# Patient Record
Sex: Male | Born: 2000 | Race: White | Hispanic: No | Marital: Single | Smoking: Never smoker
Health system: Southern US, Community
[De-identification: ages and names within clinical notes are randomized; demographics above are authoritative.]

## PROBLEM LIST (undated history)

## (undated) DIAGNOSIS — E669 Obesity, unspecified: Secondary | ICD-10-CM

## (undated) DIAGNOSIS — R638 Other symptoms and signs concerning food and fluid intake: Secondary | ICD-10-CM

## (undated) DIAGNOSIS — T7840XA Allergy, unspecified, initial encounter: Secondary | ICD-10-CM

## (undated) HISTORY — DX: Allergy, unspecified, initial encounter: T78.40XA

## (undated) HISTORY — DX: Other symptoms and signs concerning food and fluid intake: R63.8

## (undated) HISTORY — DX: Obesity, unspecified: E66.9

---
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
## 2020-02-09 ENCOUNTER — Other Ambulatory Visit: Payer: Self-pay

## 2020-02-09 ENCOUNTER — Ambulatory Visit (INDEPENDENT_AMBULATORY_CARE_PROVIDER_SITE_OTHER): Payer: Managed Care, Other (non HMO) | Admitting: Pediatrics

## 2020-02-09 VITALS — BP 110/72 | Ht 74.5 in | Wt 205.7 lb

## 2020-02-09 DIAGNOSIS — Z68.41 Body mass index (BMI) pediatric, greater than or equal to 95th percentile for age: Secondary | ICD-10-CM

## 2020-02-09 DIAGNOSIS — Z00129 Encounter for routine child health examination without abnormal findings: Secondary | ICD-10-CM

## 2020-02-09 DIAGNOSIS — Z0001 Encounter for general adult medical examination with abnormal findings: Secondary | ICD-10-CM | POA: Diagnosis not present

## 2020-02-09 DIAGNOSIS — E663 Overweight: Secondary | ICD-10-CM | POA: Diagnosis not present

## 2020-02-09 NOTE — Progress Notes (Signed)
[employer]    Adolescent Well Care Visit Charles Maynard is a 19 y.o. male who is here for well care.    PCP:  Georgiann Hahn, MD   History was provided by the patient and mother.  Confidentiality was discussed with the patient and, if applicable, with caregiver as well.   Current Issues: Current concerns include none.   Nutrition: Nutrition/Eating Behaviors: good Adequate calcium in diet?: yes Supplements/ Vitamins: yes  Exercise/ Media: Play any Sports?/ Exercise: yes Screen Time:  < 2 hours Media Rules or Monitoring?: yes  Sleep:  Sleep: 8-10 hours  Social Screening: Lives with:  parents Parental relations:  good Activities, Work, and Regulatory affairs officer?: yes Concerns regarding behavior with peers?  no Stressors of note: no  Education:  School Grade: 12 School performance: doing well; no concerns School Behavior: doing well; no concerns  Menstruation:   No LMP for male patient.    Tobacco?  no Secondhand smoke exposure?  no Drugs/ETOH?  no  Sexually Active?  no     Safe at home, in school & in relationships?  Yes Safe to self?  Yes   Screenings: Patient has a dental home: yes  The patient completed the Rapid Assessment for Adolescent Preventive Services screening questionnaire and the following topics were identified as risk factors and discussed: healthy eating, exercise, seatbelt use, bullying, abuse/trauma, weapon use, tobacco use, marijuana use, drug use, condom use, birth control, sexuality, suicidality/self harm, mental health issues, social isolation, school problems, family problems and screen time    PHQ-9 completed and results indicated --no risk  Physical Exam:  Vitals:   02/09/20 1509  BP: 110/72  Weight: (!) 205 lb 11.2 oz (93.3 kg)  Height: 6' 2.5" (1.892 m)   BP 110/72   Ht 6' 2.5" (1.892 m)   Wt (!) 205 lb 11.2 oz (93.3 kg)   BMI 26.06 kg/m  Body mass index: body mass index is 26.06 kg/m. Blood pressure  percentiles are not available for patients who are 18 years or older.   Hearing Screening   125Hz  250Hz  500Hz  1000Hz  2000Hz  3000Hz  4000Hz  6000Hz  8000Hz   Right ear:   20 20 20 20 20     Left ear:   20 20 20 20 20       Visual Acuity Screening   Right eye Left eye Both eyes  Without correction:     With correction: 10/10 10/10     General Appearance:   alert, oriented, no acute distress and well nourished  HENT: Normocephalic, no obvious abnormality, conjunctiva clear  Mouth:   Normal appearing teeth, no obvious discoloration, dental caries, or dental caps  Neck:   Supple; thyroid: no enlargement, symmetric, no tenderness/mass/nodules  Chest normal  Lungs:   Clear to auscultation bilaterally, normal work of breathing  Heart:   Regular rate and rhythm, S1 and S2 normal, no murmurs;   Abdomen:   Soft, non-tender, no mass, or organomegaly  GU normal male genitals, no testicular masses or hernia  Musculoskeletal:   Tone and strength strong and symmetrical, all extremities               Lymphatic:   No cervical adenopathy  Skin/Hair/Nails:   Skin warm, dry and intact, no rashes, no bruises or petechiae  Neurologic:   Strength, gait, and coordination normal and age-appropriate     Assessment and Plan:   Well adolescent male  BMI is appropriate for age  Hearing screening result:normal Vision screening result: normal  Return in about 1 year (around 02/08/2021).Marland Kitchen  Georgiann Hahn, MD

## 2020-02-09 NOTE — Patient Instructions (Signed)

## 2020-02-10 ENCOUNTER — Encounter: Payer: Self-pay | Admitting: Pediatrics

## 2020-02-16 ENCOUNTER — Other Ambulatory Visit: Payer: Self-pay

## 2020-02-16 ENCOUNTER — Ambulatory Visit: Payer: Managed Care, Other (non HMO) | Admitting: Pediatrics

## 2020-02-16 ENCOUNTER — Encounter: Payer: Self-pay | Admitting: Pediatrics

## 2020-02-16 VITALS — Wt 208.1 lb

## 2020-02-16 DIAGNOSIS — R1084 Generalized abdominal pain: Secondary | ICD-10-CM | POA: Diagnosis not present

## 2020-02-16 NOTE — Patient Instructions (Signed)
Abdominal xray at [facility] [address] [facility] [facility]oon- will call with results Keep log of abdominal pain and diet Will refer to GI

## 2020-02-16 NOTE — Progress Notes (Signed)
Subjective:    History was provided by the patient. Charles Maynard is a 19 y.o. male who presents for evaluation of abdominal pain. Two weeks ago, Stefano developed abdominal pain in the mornings when he wakes up. He feels bloated frequently.During the day, primarily in the mornings, he will feel the need to use the bathroom but will only pass gas or a small amount of stool. He had diarrhea for the first 4 days but the diarrhea has resolved. He reports that the pain is the worst when he first gets up in the morning and improves during the day. He denies any fevers, vomiting, recent travel, ingestion of new foods. He did start a low carb diet prior to symptoms developing.   The following portions of the patient's history were reviewed and updated as appropriate: allergies, current medications, past family history, past medical history, past social history, past surgical history and problem list.  Review of Systems Pertinent items are noted in HPI    Objective:    Wt 208 lb 1.6 oz (94.4 kg)   BMI 26.36 kg/m  General:   alert, cooperative, appears stated age and no distress  Oropharynx:  lips, mucosa, and tongue normal; teeth and gums normal   Eyes:   conjunctivae/corneas clear. PERRL, EOM's intact. Fundi benign.   Ears:   normal TM's and external ear canals both ears  Neck:  no adenopathy, no carotid bruit, no JVD, supple, symmetrical, trachea midline and thyroid not enlarged, symmetric, no tenderness/mass/nodules  Thyroid:   no palpable nodule  Lung:  clear to auscultation bilaterally  Heart:   regular rate and rhythm, S1, S2 normal, no murmur, click, rub or gallop  Abdomen:  normal findings: soft, non-tender and abnormal findings:  hypoactive bowel sounds  Extremities:  extremities normal, atraumatic, no cyanosis or edema  Skin:  warm and dry, no hyperpigmentation, vitiligo, or suspicious lesions  CVA:   absent  Genitourinary:  defer exam  Neurological:   negative  Psychiatric:   normal  mood, behavior, speech, dress, and thought processes      Assessment:    Nonspecific abdominal pain, non organic etiology    Plan:     The diagnosis was discussed with the patient and evaluation and treatment plans outlined. See orders for lab and imaging studies. Reassured patient that symptoms are almost certainly benign and self-resolving. Adhere to low fat diet.  Referral to GI for further evaluation of abdominal pain Follow up as needed    Will call with abdominal xray results [phone]

## 2020-02-17 ENCOUNTER — Ambulatory Visit
Admission: RE | Admit: 2020-02-17 | Discharge: 2020-02-17 | Disposition: A | Discharge status: Home / Self Care | Payer: Managed Care, Other (non HMO) | Source: Ambulatory Visit | Attending: Pediatrics | Admitting: Pediatrics

## 2020-02-17 ENCOUNTER — Telehealth: Payer: Self-pay | Admitting: Pediatrics

## 2020-02-17 DIAGNOSIS — R1084 Generalized abdominal pain: Secondary | ICD-10-CM

## 2020-02-17 NOTE — Telephone Encounter (Signed)
Called Charles Maynard with abdominal xray results. Xray showed large stool burden Instructed [name] to take a daily stool softener until having regular bowel movements. [name] verbalized understanding and agreement.

## 2020-02-18 ENCOUNTER — Telehealth: Payer: Self-pay | Admitting: Pediatrics

## 2020-02-18 NOTE — Telephone Encounter (Signed)
Discussed abdominal xray results with mom as well as functional constipation S/S. Mom verbalized understanding and agreement.

## 2020-02-18 NOTE — Telephone Encounter (Signed)
Mom would like to you about the abdominal xray done on Charles Maynard she has some questions

## 2020-02-23 ENCOUNTER — Telehealth: Payer: Self-pay | Admitting: Pediatrics

## 2020-02-23 NOTE — Telephone Encounter (Signed)
Called and tried to contact to see if appointment time and date was appropriate. No answer/left message.

## 2021-01-15 IMAGING — CR DG ABDOMEN 1V
2 series · 2 of 2 positions shown · non-contrast
Comparison: None.

CLINICAL DATA: Acute abdominal pain and bloating.

EXAM:
ABDOMEN - 1 VIEW

[t abdomen supine (1 of 2)]
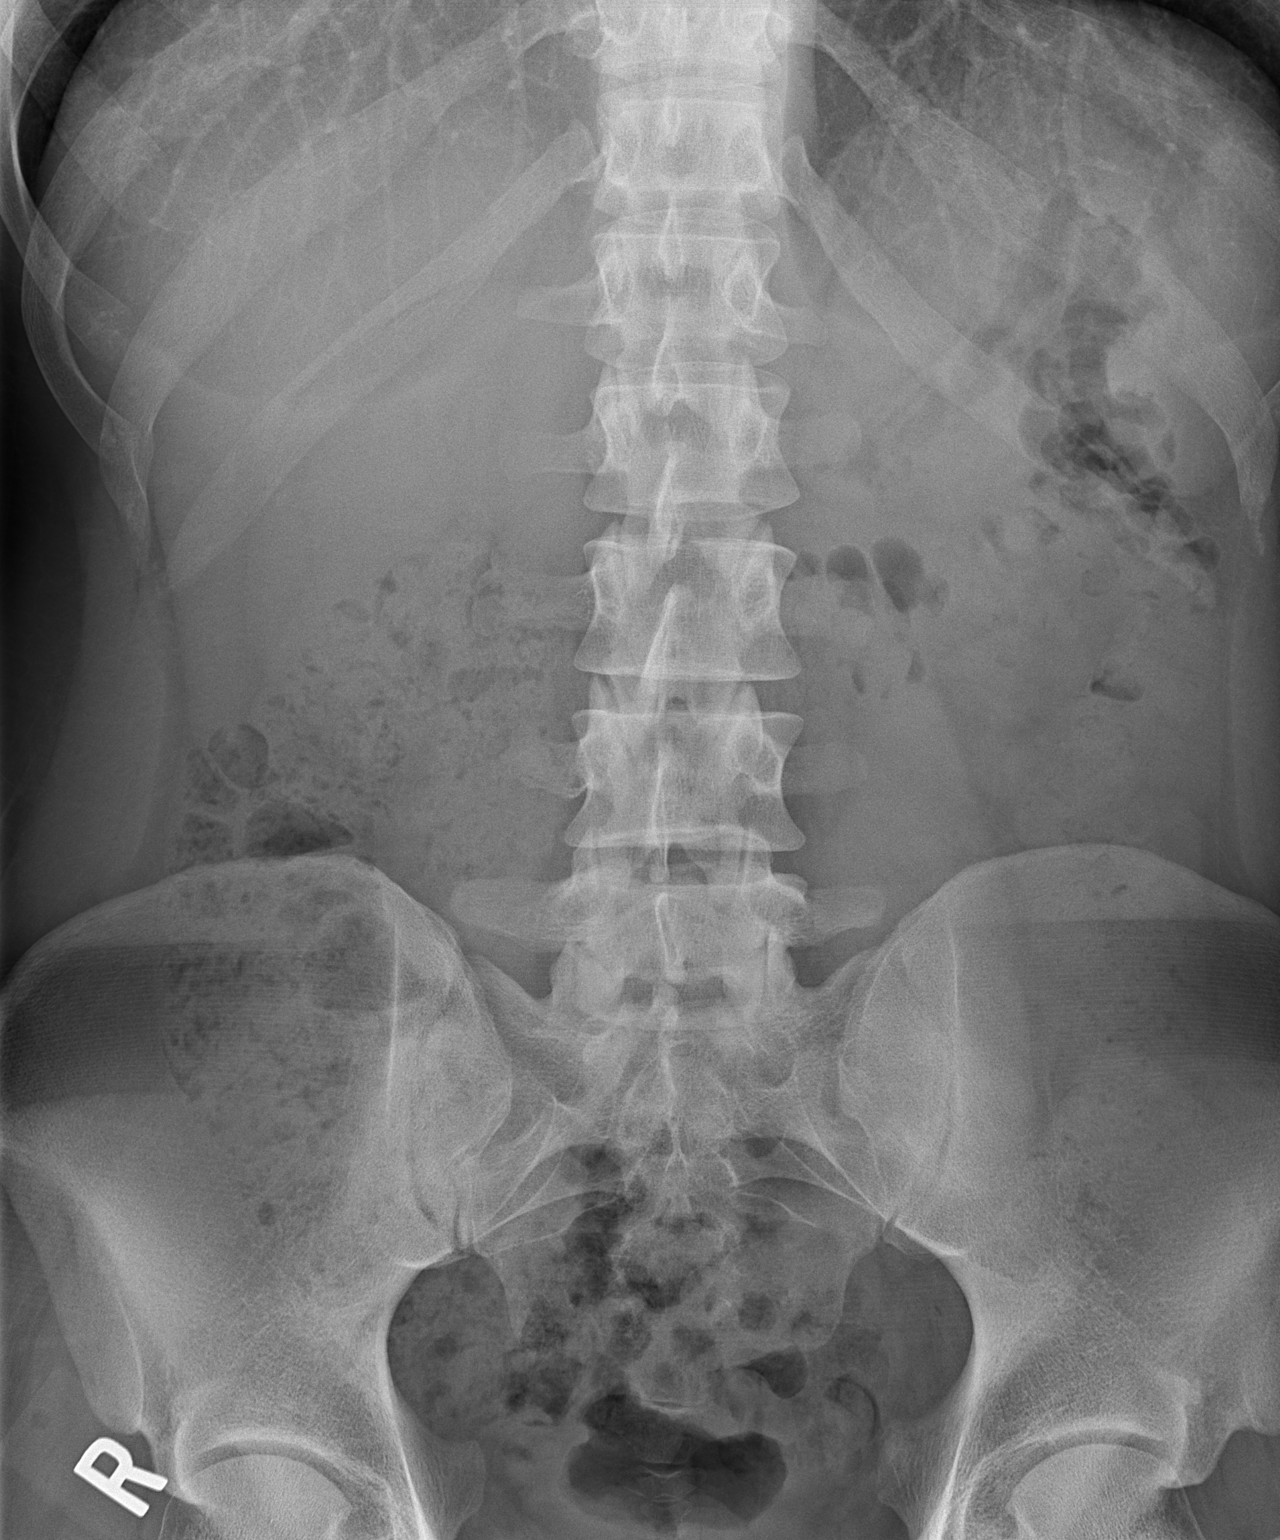

[t abdomen supine (2 of 2)]
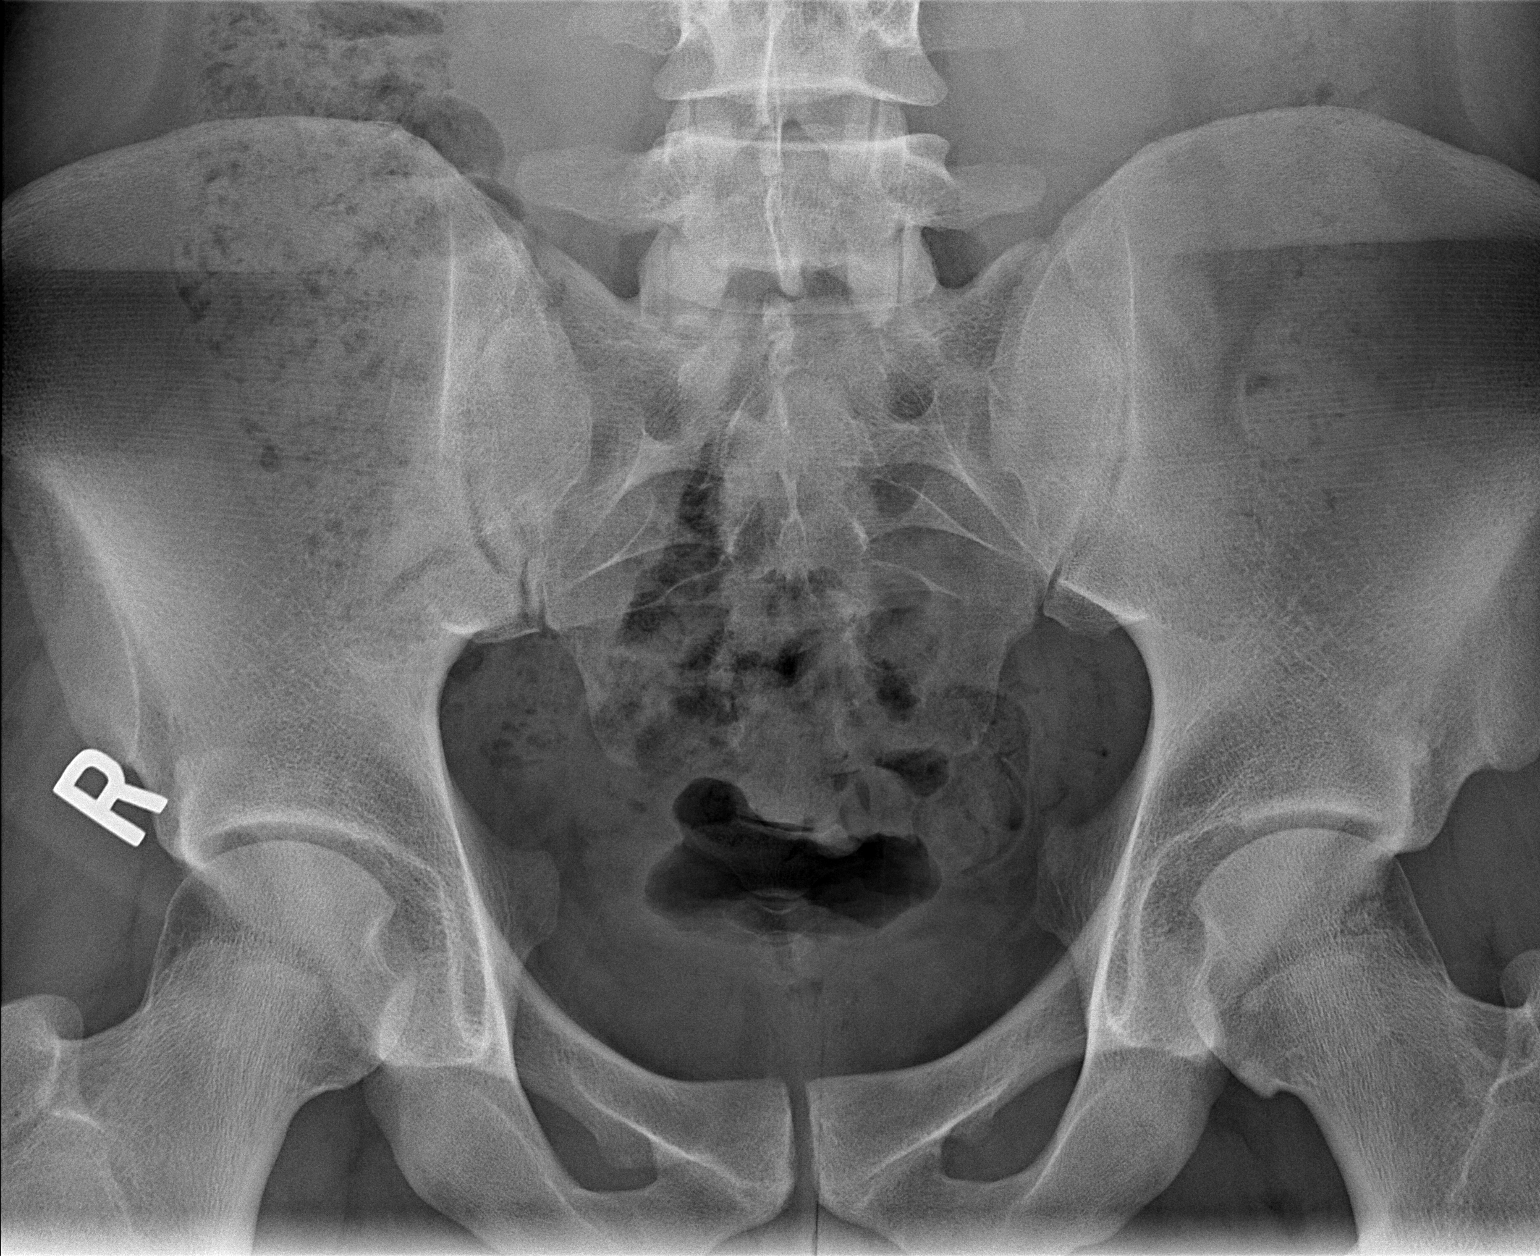

[2 of 2 positions shown; findings below may reference images not displayed]

FINDINGS: No abnormal bowel dilatation is noted. Large amount of stool is seen
throughout the colon. No radio-opaque calculi or other significant
radiographic abnormality are seen.
IMPRESSION: Large stool burden. No evidence of bowel obstruction or ileus.

## 2021-04-20 ENCOUNTER — Ambulatory Visit: Payer: Managed Care, Other (non HMO) | Admitting: Pediatrics

## 2022-02-26 ENCOUNTER — Encounter: Payer: Self-pay | Admitting: Pediatrics

## 2022-06-05 DIAGNOSIS — Z23 Encounter for immunization: Secondary | ICD-10-CM | POA: Diagnosis not present

## 2022-06-05 DIAGNOSIS — Z0000 Encounter for general adult medical examination without abnormal findings: Secondary | ICD-10-CM | POA: Diagnosis not present

## 2023-03-26 ENCOUNTER — Encounter: Payer: Self-pay | Admitting: Pediatrics
